# Patient Record
Sex: Female | Born: 1945 | Race: White | Hispanic: No | Marital: Single | Smoking: Never smoker
Health system: Southern US, Community
[De-identification: ages and names within clinical notes are randomized; demographics above are authoritative.]

## PROBLEM LIST (undated history)

## (undated) DIAGNOSIS — I951 Orthostatic hypotension: Secondary | ICD-10-CM

## (undated) DIAGNOSIS — F419 Anxiety disorder, unspecified: Secondary | ICD-10-CM

## (undated) DIAGNOSIS — M1990 Unspecified osteoarthritis, unspecified site: Secondary | ICD-10-CM

## (undated) DIAGNOSIS — F319 Bipolar disorder, unspecified: Secondary | ICD-10-CM

## (undated) DIAGNOSIS — I10 Essential (primary) hypertension: Secondary | ICD-10-CM

## (undated) DIAGNOSIS — R001 Bradycardia, unspecified: Secondary | ICD-10-CM

## (undated) DIAGNOSIS — Z950 Presence of cardiac pacemaker: Secondary | ICD-10-CM

## (undated) HISTORY — PX: ABDOMINAL HYSTERECTOMY: SHX81

## (undated) HISTORY — PX: CARDIAC SURGERY: SHX584

## (undated) HISTORY — PX: BACK SURGERY: SHX140

## (undated) HISTORY — PX: REPLACEMENT TOTAL KNEE: SUR1224

## (undated) HISTORY — PX: TONSILLECTOMY: SUR1361

---

## 2004-08-22 ENCOUNTER — Ambulatory Visit: Payer: Self-pay | Admitting: Unknown Physician Specialty

## 2004-09-25 ENCOUNTER — Ambulatory Visit: Payer: Self-pay | Admitting: Unknown Physician Specialty

## 2005-09-27 ENCOUNTER — Ambulatory Visit: Payer: Self-pay | Admitting: Unknown Physician Specialty

## 2006-02-28 ENCOUNTER — Ambulatory Visit: Payer: Self-pay | Admitting: Unknown Physician Specialty

## 2006-03-01 ENCOUNTER — Ambulatory Visit: Payer: Self-pay | Admitting: Surgery

## 2006-07-02 ENCOUNTER — Ambulatory Visit: Payer: Self-pay | Admitting: Unknown Physician Specialty

## 2006-08-14 ENCOUNTER — Inpatient Hospital Stay (HOSPITAL_COMMUNITY): Admission: RE | Admit: 2006-08-14 | Discharge: 2006-08-16 | Payer: Self-pay | Admitting: Neurosurgery

## 2006-10-03 ENCOUNTER — Ambulatory Visit: Payer: Self-pay | Admitting: Neurosurgery

## 2007-02-28 ENCOUNTER — Ambulatory Visit: Payer: Self-pay | Admitting: Internal Medicine

## 2007-03-15 ENCOUNTER — Ambulatory Visit: Payer: Self-pay | Admitting: Internal Medicine

## 2007-07-17 ENCOUNTER — Ambulatory Visit: Payer: Self-pay | Admitting: Family Medicine

## 2010-02-14 ENCOUNTER — Ambulatory Visit: Payer: Self-pay | Admitting: Gastroenterology

## 2010-05-15 ENCOUNTER — Ambulatory Visit: Payer: Self-pay | Admitting: Unknown Physician Specialty

## 2011-11-26 ENCOUNTER — Ambulatory Visit: Payer: Self-pay | Admitting: Unknown Physician Specialty

## 2012-12-01 ENCOUNTER — Ambulatory Visit: Payer: Self-pay | Admitting: Unknown Physician Specialty

## 2013-03-06 ENCOUNTER — Emergency Department: Payer: Self-pay | Admitting: Emergency Medicine

## 2013-03-06 LAB — CBC
MCHC: 35 g/dL (ref 32.0–36.0)
Platelet: 226 10*3/uL (ref 150–440)
RBC: 4.49 10*6/uL (ref 3.80–5.20)
RDW: 12.5 % (ref 11.5–14.5)
WBC: 5.4 10*3/uL (ref 3.6–11.0)

## 2013-03-06 LAB — URINALYSIS, COMPLETE
Bacteria: NONE SEEN
Blood: NEGATIVE
Nitrite: NEGATIVE
Protein: NEGATIVE
Squamous Epithelial: <1
WBC UR: 1 /HPF (ref 0–5)

## 2013-03-06 LAB — COMPREHENSIVE METABOLIC PANEL
Albumin: 3.9 g/dL (ref 3.4–5.0)
Bilirubin,Total: 0.5 mg/dL (ref 0.2–1.0)
Calcium, Total: 9.7 mg/dL (ref 8.5–10.1)
Chloride: 102 mmol/L (ref 98–107)
Creatinine: 1.57 mg/dL — ABNORMAL HIGH (ref 0.60–1.30)
EGFR (African American): 39 — ABNORMAL LOW
EGFR (Non-African Amer.): 34 — ABNORMAL LOW
Osmolality: 279 (ref 275–301)
Potassium: 3.5 mmol/L (ref 3.5–5.1)
SGOT(AST): 22 U/L (ref 15–37)
Sodium: 136 mmol/L (ref 136–145)

## 2013-03-15 ENCOUNTER — Inpatient Hospital Stay: Payer: Self-pay | Admitting: Internal Medicine

## 2013-03-15 LAB — BASIC METABOLIC PANEL
Calcium, Total: 9.1 mg/dL (ref 8.5–10.1)
Chloride: 107 mmol/L (ref 98–107)
Co2: 26 mmol/L (ref 21–32)
Creatinine: 1.19 mg/dL (ref 0.60–1.30)
EGFR (Non-African Amer.): 47 — ABNORMAL LOW
Glucose: 113 mg/dL — ABNORMAL HIGH (ref 65–99)
Sodium: 139 mmol/L (ref 136–145)

## 2013-03-15 LAB — TROPONIN I: Troponin-I: <0.02 ng/mL

## 2013-03-15 LAB — CBC
MCV: 85 fL (ref 80–100)
RDW: 12.8 % (ref 11.5–14.5)

## 2013-03-16 LAB — CBC WITH DIFFERENTIAL/PLATELET
Basophil %: 1 %
Eosinophil #: 0.2 10*3/uL (ref 0.0–0.7)
HGB: 11 g/dL — ABNORMAL LOW (ref 12.0–16.0)
Lymphocyte %: 42.3 %
MCH: 29.2 pg (ref 26.0–34.0)
MCV: 85 fL (ref 80–100)
Neutrophil %: 42.9 %
RBC: 3.77 10*6/uL — ABNORMAL LOW (ref 3.80–5.20)
RDW: 12.5 % (ref 11.5–14.5)

## 2013-03-16 LAB — BASIC METABOLIC PANEL
BUN: 19 mg/dL — ABNORMAL HIGH (ref 7–18)
Chloride: 109 mmol/L — ABNORMAL HIGH (ref 98–107)
Co2: 29 mmol/L (ref 21–32)
EGFR (African American): 54 — ABNORMAL LOW
Sodium: 142 mmol/L (ref 136–145)

## 2013-03-16 LAB — TSH: Thyroid Stimulating Horm: 2.4 u[IU]/mL

## 2013-03-16 LAB — CK TOTAL AND CKMB (NOT AT ARMC)
CK, Total: 51 U/L (ref 21–215)
CK-MB: <0.5 ng/mL — ABNORMAL LOW (ref 0.5–3.6)

## 2013-03-16 LAB — TROPONIN I: Troponin-I: <0.02 ng/mL

## 2013-03-17 LAB — CBC WITH DIFFERENTIAL/PLATELET
Basophil %: 0.8 %
Eosinophil #: 0.1 10*3/uL (ref 0.0–0.7)
HCT: 32.7 % — ABNORMAL LOW (ref 35.0–47.0)
HGB: 11.2 g/dL — ABNORMAL LOW (ref 12.0–16.0)
Lymphocyte #: 1.7 10*3/uL (ref 1.0–3.6)
MCH: 29 pg (ref 26.0–34.0)
Monocyte %: 10.5 %
Neutrophil #: 2.5 10*3/uL (ref 1.4–6.5)
WBC: 4.8 10*3/uL (ref 3.6–11.0)

## 2013-03-17 LAB — BASIC METABOLIC PANEL
Anion Gap: 5 — ABNORMAL LOW (ref 7–16)
Co2: 29 mmol/L (ref 21–32)
Creatinine: 1.1 mg/dL (ref 0.60–1.30)
EGFR (African American): >60
EGFR (Non-African Amer.): 52 — ABNORMAL LOW
Glucose: 109 mg/dL — ABNORMAL HIGH (ref 65–99)

## 2013-03-18 LAB — CBC WITH DIFFERENTIAL/PLATELET
Basophil #: 0.1 10*3/uL (ref 0.0–0.1)
Eosinophil #: 0.1 10*3/uL (ref 0.0–0.7)
Eosinophil %: 2.5 %
HCT: 32.9 % — ABNORMAL LOW (ref 35.0–47.0)
HGB: 11.4 g/dL — ABNORMAL LOW (ref 12.0–16.0)
Lymphocyte #: 1.2 10*3/uL (ref 1.0–3.6)
Lymphocyte %: 30.2 %
MCH: 29.1 pg (ref 26.0–34.0)
MCV: 84 fL (ref 80–100)
Monocyte #: 0.5 x10 3/mm (ref 0.2–0.9)
Monocyte %: 12.3 %
Platelet: 186 10*3/uL (ref 150–440)
RBC: 3.92 10*6/uL (ref 3.80–5.20)
RDW: 12.8 % (ref 11.5–14.5)

## 2013-03-18 LAB — BASIC METABOLIC PANEL
Chloride: 106 mmol/L (ref 98–107)
Osmolality: 285 (ref 275–301)
Potassium: 4.2 mmol/L (ref 3.5–5.1)

## 2013-07-20 ENCOUNTER — Emergency Department: Payer: Self-pay | Admitting: Emergency Medicine

## 2013-07-20 LAB — COMPREHENSIVE METABOLIC PANEL
BUN: 19 mg/dL — ABNORMAL HIGH (ref 7–18)
Calcium, Total: 9.3 mg/dL (ref 8.5–10.1)
Chloride: 103 mmol/L (ref 98–107)
Co2: 29 mmol/L (ref 21–32)
Creatinine: 1.21 mg/dL (ref 0.60–1.30)
EGFR (African American): 54 — ABNORMAL LOW
EGFR (Non-African Amer.): 46 — ABNORMAL LOW
Glucose: 107 mg/dL — ABNORMAL HIGH (ref 65–99)
Osmolality: 277 (ref 275–301)
SGOT(AST): 25 U/L (ref 15–37)
Sodium: 137 mmol/L (ref 136–145)

## 2013-07-20 LAB — CBC
MCH: 28.4 pg (ref 26.0–34.0)
MCV: 83 fL (ref 80–100)
RBC: 4.61 10*6/uL (ref 3.80–5.20)

## 2013-07-20 LAB — URINALYSIS, COMPLETE
Bacteria: NONE SEEN
Leukocyte Esterase: NEGATIVE
Ph: 7 (ref 4.5–8.0)
Squamous Epithelial: NONE SEEN
WBC UR: 1 /HPF (ref 0–5)

## 2013-07-20 LAB — DRUG SCREEN, URINE
Amphetamines, Ur Screen: NEGATIVE (ref ?–1000)
Barbiturates, Ur Screen: NEGATIVE (ref ?–200)
Cannabinoid 50 Ng, Ur ~~LOC~~: NEGATIVE (ref ?–50)
Phencyclidine (PCP) Ur S: NEGATIVE (ref ?–25)

## 2013-07-20 LAB — SALICYLATE LEVEL: Salicylates, Serum: <1.7 mg/dL

## 2013-10-19 ENCOUNTER — Ambulatory Visit: Payer: Self-pay | Admitting: Neurology

## 2013-11-16 ENCOUNTER — Ambulatory Visit: Payer: Self-pay | Admitting: Neurology

## 2014-01-07 ENCOUNTER — Ambulatory Visit: Payer: Self-pay | Admitting: Unknown Physician Specialty

## 2015-02-18 NOTE — Op Note (Signed)
PATIENT NAME:  Latoya DixonJOHNSON, [name] MR#:  [id number] DATE OF BIRTH:  April 16, 1946  DATE OF PROCEDURE:  03/17/2013  PRIMARY CARE PHYSICIAN:  Aram BeechamJeffrey Sparks, MD  PREPROCEDURE DIAGNOSIS: Sick sinus syndrome.   PROCEDURE: Dual chamber pacemaker implantation.   POSTPROCEDURE DIAGNOSIS: Atrial sensing with ventricular pacing.   INDICATION: The patient is a 69 year old female who was admitted on 03/16/2013 for chest pain. During the hospitalization, the patient has been noted to be bradycardic with sinus bradycardia with rates in the 30s and 40s. Procedure and the risks, benefits, and alternatives of permanent pacemaker implantation were explained to the patient and informed written consent was obtained.   DESCRIPTION OF PROCEDURE: She was brought to the operating room in a fasting state. The left pectoral region was prepped and draped in the usual sterile manner. Anesthesia was obtained with 1% Xylocaine locally. A 6 cm incision was performed over the left pectoral region. The pacemaker pocket was generated by electrocautery and blunt dissection. Access was obtained to the left subclavian vein by fine needle aspiration. Right ventricular and right atrial leads were positioned in the right ventricular apex and right atrial appendage under fluoroscopic guidance. After proper thresholds were obtained, the leads were sutured in place. The leads were connected to a dual chamber rate responsive pacemaker generator (Medtronic Adapta [id number]). The pacemaker pocket was irrigated with gentamicin solution. The pacemaker generator was positioned into the pocket; and the pocket was closed with 2-0 and 4-0 Vicryl, respectively. Steri-Strips and a pressure dressing were applied.  ____________________________ Marcina MillardAlexander Jed Kutch, MD ap:sb D: 03/17/2013 13:48:00 ET T: 03/17/2013 14:22:02 ET JOB#: [id number]  cc: Marcina MillardAlexander Mardell Cragg, MD, <Dictator> Marcina MillardALEXANDER Naphtali Zywicki MD ELECTRONICALLY SIGNED 03/20/2013 8:23

## 2015-02-18 NOTE — Consult Note (Signed)
PATIENT NAME:  Latoya [name], HANNIS MR#:  [id number] DATE OF BIRTH:  [birth year]  DATE OF CONSULTATION:  03/16/2013  REFERRING PHYSICIAN:  Aram Beecham, MD CONSULTING PHYSICIAN:  Lamar Blinks, MD  REASON FOR CONSULTATION: Bradycardia with unstable angina.   CHIEF COMPLAINT: "I have chest pain."   HISTORY OF PRESENT ILLNESS:  This is a 69 year old female with known hypertension, on appropriate medication with good blood pressure control, who has had new onset of substernal chest pain radiating into the back occurring at rest and with physical activity and spontaneously resolved without evidence of myocardial infarction with a normal troponin and an EKG showing sinus bradycardia with left axis deviation and right bundle branch block. She had a cardiac catheterization in 2008 showing normal coronary arteries. She has not had any beta blockers or other medications that could cause any significant bradycardia. The patient does have some resolution of this chest pain and is currently stable. There has been no other further significant symptoms this morning. Hypertension control has been quite good on losartan and the patient does have hyperlipidemia on appropriate medications without evidence of apparent side effects.  The patient has had some bradycardia in the last several weeks, significantly last night to a heart rate of 34 beats per minute, but no evidence of symptoms other than dizziness. There has been on syncope at this time.   REVIEW OF SYSTEMS: The remainder is negative for vision change, ringing in the ears, hearing loss, cough, congestion, heartburn, nausea, vomiting, diarrhea, bloody stools, stomach pain, extremity pain, leg weakness, cramping of the buttocks, known blood clots, headaches, blackouts, nosebleeds, congestion, trouble swallowing, frequent urination at night, muscle weakness, numbness, anxiety, depression, skin lesions, or skin rashes.   PAST MEDICAL HISTORY: 1.  Hypertension.   2.  Hyperlipidemia. 3.  Bradycardia.   FAMILY HISTORY: Father and mother have had sick sinus syndrome and coronary artery disease in a brother.   SOCIAL HISTORY: Currently denies tobacco use. Occasionally drinking alcohol.   ALLERGIES: Listed.   MEDICATIONS:  As listed.  PHYSICAL EXAMINATION: VITAL SIGNS: Blood pressure is 126/68 bilaterally and heart rate is 56 upright and reclining and regular.  GENERAL: She is a well appearing female in no acute distress.  HEENT: No icterus, thyromegaly, ulcers, hemorrhage, or xanthelasma.  HEART:  Regular rate and rhythm. Normal S1 and S2 without murmur, gallop, or rub. PMI is normal size and placement. Carotid upstroke normal without bruit. Jugular venous pressure normal. LUNGS: Few basilar crackles with normal respirations.  ABDOMEN: Soft and nontender without hepatosplenomegaly or masses. Abdominal aorta is normal size without bruit.  EXTREMITIES: 2+ bilateral pulses in dorsal, pedal, radial, and femoral arteries without lower extremity edema, cyanosis, clubbing, or ulcers.  NEUROLOGIC: Oriented to time, place, and person with normal mood and affect.   ASSESSMENT: A 69 year old female with hypertension, hyperlipidemia, significant bradycardia with dizziness and family history of cardiovascular disease with an abnormal EKG needing further evaluation and treatment options with no evidence of myocardial infarction at this time.   RECOMMENDATIONS: 1.  Further serial ECG and enzymes to assess for possible myocardial infarction.  2.  Treadmill EKG to assess for chronotropic incompetence, myocardial ischemia, chest discomfort, and worsening symptoms.  3.  Unable to give beta blocker due to bradycardia.  4.  Echocardiogram for left ventricular systolic dysfunction and valvular heart disease contributing to current issues, chest discomfort, and bradycardia with adjustments thereof as necessary.  5.  Further evaluation of the possibility of pacemaker  placement with dual-chamber pacemaker  if necessary for symptomatic bradycardia. The patient understands the risks and benefits of pacemaker placement including death, stroke, bleeding, blood clot, bradycardia, and reactions to medications. She is at low risk for sedation  without changes at this time.  ____________________________ Lamar BlinksBruce J. Calin Ellery, MD bjk:sb D: 03/16/2013 09:37:25 ET T: 03/16/2013 12:59:49 ET JOB#: [id number]  cc: Lamar BlinksBruce J. Severus Brodzinski, MD, <Dictator> Lamar BlinksBRUCE J Chimaobi Casebolt MD ELECTRONICALLY SIGNED 03/18/2013 14:11

## 2015-02-18 NOTE — Discharge Summary (Signed)
PATIENT NAME:  Latoya [name], Latoya [name] MR#:  [id number] DATE OF BIRTH:  13-Jun-1946  DATE OF ADMISSION:  03/15/2013 DATE OF DISCHARGE:  03/18/2013  REASON FOR ADMISSION: Chest pain, with bradycardia.   HISTORY OF PRESENT ILLNESS: Please see the dictated HPI done by [place] on 03/15/2013.   PAST MEDICAL HISTORY: 1.  Depression.  2.  Osteoarthritis.  3.  Bipolar disorder.  4.  Irritable bowel syndrome.  5.  Benign hypertension.  6.  Obesity.  7.  Reactive airways disease.   MEDICATIONS ON ADMISSION: Please see admission note.   ALLERGIES: LATEX and PENICILLIN.   SOCIAL HISTORY: Negative for alcohol or tobacco abuse.   FAMILY HISTORY: Positive for coronary artery disease, but otherwise unremarkable.   REVIEW OF SYSTEMS: As per HPI.   PHYSICAL EXAMINATION: GENERAL: The patient was in no acute distress.  VITAL SIGNS: Vital signs remarkable for [name] blood pressure of 130/69, with [name] heart rate of 50, which dropped to 38 on telemetry.  HEENT EXAM: Unremarkable.  NECK: Was supple, without JVD.  LUNGS: Were clear.  CARDIAC EXAM: Revealed [name] slow rate, with [name] regular rhythm. Normal S1, S2.  ABDOMEN: Was soft and nontender.  EXTREMITIES: Without edema.  NEUROLOGIC EXAM: Was grossly nonfocal.   HOSPITAL COURSE: The patient was admitted with bradycardia and dizziness. She was monitored on telemetry. She was seen in consultation by cardiology. The patient underwent echocardiogram and stress Myoview, which were both unremarkable. Pacemaker implantation was recommended and the patient agreed. The patient underwent pacemaker implant on 03/17/2013. This was done by Dr. Darrold JunkerParaschos. The patient tolerated the procedure well. She was monitored overnight on telemetry with her pacemaker and did well. Her cardiac enzymes were normal throughout her hospitalization. By 03/18/2013 the patient was stable and ready for discharge.   DISCHARGE DIAGNOSES: 1.  Symptomatic bradycardia.  2.  Dizziness.  3.  Benign  hypertension.  4.  Sick sinus syndrome.  5.  Bipolar disorder.  6.  Depression.   DISCHARGE MEDICATIONS: 1.  Losartan 100 mg p.o. daily.  2.  Celebrex 200 mg p.o. b.i.d.  3.  Klonopin 0.5 mg p.o. b.i.d.  4.  Lamictal 225 mg p.o. at bedtime.  5.  Trazodone 50 mg p.o. at bedtime.  6.  Risperdal 0.5 mg p.o. at bedtime.  7.  Lovastatin 20 mg p.o. at bedtime.  8.  Vitamin D3 2000 units p.o. daily.  9.  Aspirin 81 mg p.o. daily.  10.  Zoloft 50 mg p.o. daily.  11.  Keflex 250 mg p.o. 4 times daily x 10 days.   FOLLOW-UP PLANS AND APPOINTMENTS: The patient was discharged on [name] low sodium diet. No heavy lifting. She is not to shower until the day after discharge.   She will follow with myself and Dr. Darrold JunkerParaschos in the office in 7 to 10 days; sooner if needed.   ____________________________ Duane LopeJeffrey D. Judithann SheenSparks, MD jds:dm D: 03/22/2013 01:37:34 ET T: 03/22/2013 10:32:19 ET JOB#: [id number]  cc: Duane LopeJeffrey D. Judithann SheenSparks, MD, <Dictator> Tahje Borawski Rodena Medin Meaghen Vecchiarelli MD ELECTRONICALLY SIGNED 03/23/2013 12:15

## 2015-02-18 NOTE — H&P (Signed)
PATIENT NAME:  Latoya [name], Latoya [name] MR#:  [id number] DATE OF BIRTH:  08-17-1946  DATE OF ADMISSION:  03/15/2013  PRIMARY CARE PHYSICIAN: Maurine MinisterMiriam McLaughlin, PA[facility]C [facility]  (The patient used to see Silver HugueninAileen Miller, MD.)  ER REFERRING PHYSICIAN: Su Leyobert L. Kinner, MD    CHIEF COMPLAINT: Chest pain.   HISTORY OF PRESENT ILLNESS: [name] 69 year old female patient with history of depression, bipolar disorder, came in because of chest pain started this morning. The patient says that she has chest pain on the right side of the chest not radiating to the arms or the back.  It is like [name] sharp pain, 10 out of 10 in severity. Also, she has had some nausea and shortness of breath. No cough. No fever. The patient has been having dizziness for the past 2 weeks, had [name] Holter as an outpatient, and she just finished the Holter on today morning. The patient had [name] Holter put on Friday, and today morning she finished it, and the patient has been having low heart rate around 40s, and that is why she had the Holter done.  She is supposed to see Dr. Gwen PoundsKowalski. The patient says that she has been having dizziness for the past 2 weeks, did not have any syncope and did not get any new medications. Here, the heart rate is as low as 39 on the telemetry. The patient's EKG shows sinus brady with 52 beats and no ST-T changes.   PAST MEDICAL HISTORY: Significant for depression, degenerative joint disease, bipolar disorder, irritable bowel syndrome, hypertension, obesity, reactive airway disease.   ALLERGIES: SHE IS ALLERGIC TO LATEX TAPE AND PENICILLIN.   SOCIAL HISTORY: No smoking, no drinking.   FAMILY HISTORY: Significant for coronary artery disease. The patient's brother had bypass surgery at the age of 70.  Mother had heart disease at the age of 70, and even other sisters also had coronary artery disease with stents and pacemakers.   PAST SURGICAL HISTORY: Significant for tonsillectomy, oophorectomy, hysterectomy, right  inguinal hernia surgery, history of L4 to L5 herniated disk surgery, history of bilateral foot surgeries and history of right ankle fracture. The patient had [name] surgery for the back. She had [name] right knee placement in 2004.  MEDICATIONS: The patient is on Lamictal 200 mg daily, lovastatin 20 mg daily, Zoloft 50 mg p.o. daily, trazodone 50 mg at bedtime, Celebrex 200 mg p.o. b.i.d., losartan 100 mg every day.   NOTE:  HCTZ 25 mg is listed as an at home med, but she said she is not taking that anymore, recently stopped by PMD.   REVIEW OF SYSTEMS:  CONSTITUTIONAL: The patient feels dizzy. No fever. No chills.  EYES: No blurred vision.  ENT: No tinnitus. No epistaxis. No difficulty swallowing.  RESPIRATORY: No cough. No wheezing.  CARDIOVASCULAR: Has chest pain on the right side. No orthopnea. The patient does have some pedal edema. No palpitations. No syncope.  GASTROINTESTINAL: The patient does have some nausea but no vomiting, no diarrhea. No abdominal pain. No GERD.  GENITOURINARY: No dysuria.  ENDOCRINE: No polyuria or nocturia.  HEMATOLOGIC: No anemia or easy bruising.  INTEGUMENTARY: No skin rashes.  MUSCULOSKELETAL: Has back pain and joint pain.  NEUROLOGICAL: No numbness or weakness. No dysarthria.  PSYCHIATRIC: The patient does have bipolar disorder, anxiety, depression.   PHYSICAL EXAMINATION: GENERAL:  This is [name] 69 year old elderly female not in distress, answers appropriately, well-nourished and well-developed.  VITAL SIGNS: Temperature is 98.5, heart rate 51, but it has drop to  around 38 or 39 on telemetry.  Blood pressure 130/69, sats 99% on room air.  GENERAL: Alert, awake, oriented, not in distress.  HEENT: Head is normocephalic, atraumatic.  Pupils are equal, reacting to light. Nose:  No turbinate hypertrophy.  Ears:  No tympanic membrane congestion. Mouth:  No lesions in the mouth.  NECK: Supple. No JVD.  No thyroid enlargement. The patient has range of motion without pain.  RESPIRATIONS:  Clear to auscultation.  No wheeze. No rales.  CARDIOVASCULAR: The patient does have some chest wall tenderness on the right side of the chest.  S1 and S2 is regular, bradycardic. No murmurs. PMI is not displaced. Pulses are equal in carotid, femoral, and pedal pulses. The patient does have some peripheral edema. GASTROINTESTINAL:  The patient's abdomen is soft, nontender, nondistended.  Bowel sounds are present. No organomegaly.  MUSCULOSKELETAL: No joint pains at this time.  The patient does not have any joint effusion. The patient's strength and tone are equal bilaterally.  SKIN: Inspection is normal, well hydrated, no diaphoresis.  LYMPH:  No cervical lymphadenopathy.  VASCULAR:  Good pedal pulses.  NEUROLOGIC: Alert, awake, oriented. Cranial nerves II through XII are intact. Power 5 out of 5 upper and lower extremities.  Sensory intact. DTRs 2+ bilaterally.  PSYCHIATRIC: Slightly anxious but judgment and insight are adequate.   LABORATORY AND RADIOLOGICAL DATA:  Chest x-ray:  Mildly increased perihilar markings are noted, especially on the right. No focal pneumonia or evidence of pulmonary vascular congestion.   WBC 5.1, hemoglobin 12.1, hematocrit 33.7, platelets 202. Electrolytes: Sodium 139, potassium 4.2, chloride 107, bicarbonate 26, BUN 25, creatinine 1.1 and glucose 113. Troponin less than 0.02.   EKG: Sinus bradycardia with 52 beats per minute. The patient has some T-wave inversion in V1 and V2 which are nonspecific. The patient's rate is around 52 beats per minute.   ASSESSMENT AND PLAN: [name] 69 year old female with: 1.  Right side chest pain:  The patient's troponins have been negative. Her EKG is normal. The patient likely has some costochondritis, but because of her to age and also family history of coronary artery disease the patient will be admitted to hospitalist service.  We will continue aspirin and nitroglycerin.  Unable to give beta blocker because of bradycardia.   2.  Bradycardia with dizziness:  The patient's heart rate is as low as 39. The patient already had [name] Holter, and Dr. Gwen Pounds is supposed to see her as an outpatient, but we will request an inpatient consult because the patient does have symptoms due to bradycardia.  Keep her on telemetry.\  \\\rThe patient's medications are reviewed. The only medication that can affect the heart rate is Risperdal, so we are going to stop the Risperdal but continue other medications.  3.  Depression and bipolar:  She is on Zoloft, Clonazepam,  Lamictal and trazodone, continue them.  4.  Hypertension:  She is on losartan, which should be continued, but we will hold HCTZ as she has already stopped as an outpatient.   I discussed the plan with the patient and patient's sister.    TIME SPENT: About 55 minutes.    ____________________________ Katha Hamming, MD sk:cb D: 03/15/2013 17:13:35 ET T: 03/15/2013 17:39:45 ET JOB#: [id number]  cc: Katha Hamming, MD, <Dictator> Maurine Minister, PA-C Katha Hamming MD ELECTRONICALLY SIGNED 03/24/2013 23:00

## 2015-04-05 ENCOUNTER — Other Ambulatory Visit: Payer: Self-pay | Admitting: Family Medicine

## 2015-04-05 DIAGNOSIS — Z1231 Encounter for screening mammogram for malignant neoplasm of breast: Secondary | ICD-10-CM

## 2015-04-13 ENCOUNTER — Ambulatory Visit
Admission: RE | Admit: 2015-04-13 | Discharge: 2015-04-13 | Disposition: A | Discharge status: Home / Self Care | Payer: Medicare Other | Source: Ambulatory Visit | Attending: Family Medicine | Admitting: Family Medicine

## 2015-04-13 DIAGNOSIS — Z1231 Encounter for screening mammogram for malignant neoplasm of breast: Secondary | ICD-10-CM | POA: Diagnosis present

## 2016-03-07 ENCOUNTER — Encounter: Payer: Self-pay | Admitting: Emergency Medicine

## 2016-03-07 ENCOUNTER — Ambulatory Visit
Admission: EM | Admit: 2016-03-07 | Discharge: 2016-03-07 | Disposition: A | Discharge status: Home / Self Care | Payer: Medicare HMO | Attending: Family Medicine | Admitting: Family Medicine

## 2016-03-07 DIAGNOSIS — Z85528 Personal history of other malignant neoplasm of kidney: Secondary | ICD-10-CM

## 2016-03-07 DIAGNOSIS — A084 Viral intestinal infection, unspecified: Secondary | ICD-10-CM

## 2016-03-07 DIAGNOSIS — E871 Hypo-osmolality and hyponatremia: Secondary | ICD-10-CM

## 2016-03-07 HISTORY — DX: Bipolar disorder, unspecified: F31.9

## 2016-03-07 HISTORY — DX: Unspecified osteoarthritis, unspecified site: M19.90

## 2016-03-07 HISTORY — DX: Anxiety disorder, unspecified: F41.9

## 2016-03-07 HISTORY — DX: Orthostatic hypotension: I95.1

## 2016-03-07 HISTORY — DX: Bradycardia, unspecified: R00.1

## 2016-03-07 HISTORY — DX: Presence of cardiac pacemaker: Z95.0

## 2016-03-07 HISTORY — DX: Essential (primary) hypertension: I10

## 2016-03-07 LAB — URINALYSIS COMPLETE WITH MICROSCOPIC (ARMC ONLY)
BILIRUBIN URINE: NEGATIVE
Bacteria, UA: NONE SEEN
Glucose, UA: NEGATIVE mg/dL
Hgb urine dipstick: NEGATIVE
KETONES UR: NEGATIVE mg/dL
LEUKOCYTES UA: NEGATIVE
Nitrite: NEGATIVE
PH: 5.5 (ref 5.0–8.0)
PROTEIN: NEGATIVE mg/dL
WBC, UA: NONE SEEN WBC/hpf (ref 0–5)

## 2016-03-07 LAB — COMPREHENSIVE METABOLIC PANEL
ALT: 18 U/L (ref 14–54)
AST: 26 U/L (ref 15–41)
Albumin: 4.6 g/dL (ref 3.5–5.0)
Alkaline Phosphatase: 72 U/L (ref 38–126)
Anion gap: 8 (ref 5–15)
BUN: 27 mg/dL — AB (ref 6–20)
CO2: 26 mmol/L (ref 22–32)
CREATININE: 1.18 mg/dL — AB (ref 0.44–1.00)
Calcium: 9.6 mg/dL (ref 8.9–10.3)
Chloride: 95 mmol/L — ABNORMAL LOW (ref 101–111)
GFR, EST AFRICAN AMERICAN: 53 mL/min — AB (ref >60)
GFR, EST NON AFRICAN AMERICAN: 46 mL/min — AB (ref >60)
Glucose, Bld: 129 mg/dL — ABNORMAL HIGH (ref 65–99)
POTASSIUM: 3.7 mmol/L (ref 3.5–5.1)
Sodium: 129 mmol/L — ABNORMAL LOW (ref 135–145)
TOTAL PROTEIN: 8.3 g/dL — AB (ref 6.5–8.1)
Total Bilirubin: 0.8 mg/dL (ref 0.3–1.2)

## 2016-03-07 MED ORDER — KETOROLAC TROMETHAMINE 60 MG/2ML IM SOLN
15.0000 mg | Freq: Once | INTRAMUSCULAR | Status: AC
  Administered 2016-03-07: 15 mg via INTRAMUSCULAR

## 2016-03-07 NOTE — Discharge Instructions (Signed)
Food Choices to Help Relieve Diarrhea, Adult °When you have diarrhea, the foods you eat and your eating habits are very important. Choosing the right foods and drinks can help relieve diarrhea. Also, because diarrhea can last up to 7 days, you need to replace lost fluids and electrolytes (such as sodium, potassium, and chloride) in order to help prevent dehydration.  °WHAT GENERAL GUIDELINES DO I NEED TO FOLLOW? °· Slowly drink 1 cup (8 oz) of fluid for each episode of diarrhea. If you are getting enough fluid, your urine will be clear or pale yellow. °· Eat starchy foods. Some good choices include white rice, white toast, pasta, low-fiber cereal, baked potatoes (without the skin), saltine crackers, and bagels. °· Avoid large servings of any cooked vegetables. °· Limit fruit to two servings per day. A serving is ½ cup or 1 small piece. °· Choose foods with less than 2 g of fiber per serving. °· Limit fats to less than 8 tsp (38 g) per day. °· Avoid fried foods. °· Eat foods that have probiotics in them. Probiotics can be found in certain dairy products. °· Avoid foods and beverages that may increase the speed at which food moves through the stomach and intestines (gastrointestinal tract). Things to avoid include: °· High-fiber foods, such as dried fruit, raw fruits and vegetables, nuts, seeds, and whole grain foods. °· Spicy foods and high-fat foods. °· Foods and beverages sweetened with high-fructose corn syrup, honey, or sugar alcohols such as xylitol, sorbitol, and mannitol. °WHAT FOODS ARE RECOMMENDED? °Grains °White rice. White, French, or pita breads (fresh or toasted), including plain rolls, buns, or bagels. White pasta. Saltine, soda, or graham crackers. Pretzels. Low-fiber cereal. Cooked cereals made with water (such as cornmeal, farina, or cream cereals). Plain muffins. Matzo. Melba toast. Zwieback.  °Vegetables °Potatoes (without the skin). Strained tomato and vegetable juices. Most well-cooked and canned  vegetables without seeds. Tender lettuce. °Fruits °Cooked or canned applesauce, apricots, cherries, fruit cocktail, grapefruit, peaches, pears, or plums. Fresh bananas, apples without skin, cherries, grapes, cantaloupe, grapefruit, peaches, oranges, or plums.  °Meat and Other Protein Products °Baked or boiled chicken. Eggs. Tofu. Fish. Seafood. Smooth peanut butter. Ground or well-cooked tender beef, ham, veal, lamb, pork, or poultry.  °Dairy °Plain yogurt, kefir, and unsweetened liquid yogurt. Lactose-free milk, buttermilk, or soy milk. Plain hard cheese. °Beverages °Sport drinks. Clear broths. Diluted fruit juices (except prune). Regular, caffeine-free sodas such as ginger ale. Water. Decaffeinated teas. Oral rehydration solutions. Sugar-free beverages not sweetened with sugar alcohols. °Other °Bouillon, broth, or soups made from recommended foods.  °The items listed above may not be a complete list of recommended foods or beverages. Contact your dietitian for more options. °WHAT FOODS ARE NOT RECOMMENDED? °Grains °Whole grain, whole wheat, bran, or rye breads, rolls, pastas, crackers, and cereals. Wild or brown rice. Cereals that contain more than 2 g of fiber per serving. Corn tortillas or taco shells. Cooked or dry oatmeal. Granola. Popcorn. °Vegetables °Raw vegetables. Cabbage, broccoli, Brussels sprouts, artichokes, baked beans, beet greens, corn, kale, legumes, peas, sweet potatoes, and yams. Potato skins. Cooked spinach and cabbage. °Fruits °Dried fruit, including raisins and dates. Raw fruits. Stewed or dried prunes. Fresh apples with skin, apricots, mangoes, pears, raspberries, and strawberries.  °Meat and Other Protein Products °Chunky peanut butter. Nuts and seeds. Beans and lentils. Bacon.  °Dairy °High-fat cheeses. Milk, chocolate milk, and beverages made with milk, such as milk shakes. Cream. Ice cream. °Sweets and Desserts °Sweet rolls, doughnuts, and sweet breads.   Pancakes and waffles. Fats and  Oils Butter. Cream sauces. Margarine. Salad oils. Plain salad dressings. Olives. Avocados.  Beverages Caffeinated beverages (such as coffee, tea, soda, or energy drinks). Alcoholic beverages. Fruit juices with pulp. Prune juice. Soft drinks sweetened with high-fructose corn syrup or sugar alcohols. Other Coconut. Hot sauce. Chili powder. Mayonnaise. Gravy. Cream-based or milk-based soups.  The items listed above may not be a complete list of foods and beverages to avoid. Contact your dietitian for more information. WHAT SHOULD I DO IF I BECOME DEHYDRATED? Diarrhea can sometimes lead to dehydration. Signs of dehydration include dark urine and dry mouth and skin. If you think you are dehydrated, you should rehydrate with an oral rehydration solution. These solutions can be purchased at pharmacies, retail stores, or online.  Drink -1 cup (120-240 mL) of oral rehydration solution each time you have an episode of diarrhea. If drinking this amount makes your diarrhea worse, try drinking smaller amounts more often. For example, drink 1-3 tsp (5-15 mL) every 5-10 minutes.  A general rule for staying hydrated is to drink 1-2 L of fluid per day. Talk to your health care provider about the specific amount you should be drinking each day. Drink enough fluids to keep your urine clear or pale yellow.   This information is not intended to replace advice given to you by your health care provider. Make sure you discuss any questions you have with your health care provider.   Document Released: 01/05/2004 Document Revised: 11/05/2014 Document Reviewed: 09/07/2013 Elsevier Interactive Patient Education 2016 [employer]  Hyponatremia Hyponatremia is when the amount of salt (sodium) in your blood is too low. When salt levels are low, your cells absorb extra water and they swell. The swelling happens throughout the body, but it mostly affects the brain.  HOME CARE  Take medicines only as told by your doctor.  Many medicines can make this condition worse. Talk with your doctor about any medicines that you are currently taking.  Carefully follow a recommended diet as told by your doctor.  Carefully follow instructions from your doctor about fluid restrictions.  Keep all follow-up visits as told by your doctor. This is important.  Do not drink alcohol. GET HELP IF:  You feel sicker to your stomach (nauseous).  You feel more confused.  You feel more tired (fatigued).  Your headache gets worse.  You feel weaker.  Your symptoms go away and then they come back.  You have trouble following the diet instructions. GET HELP RIGHT AWAY IF:  You start to twitch and shake (have a seizure).  You pass out (faint).  You keep having watery poop (diarrhea).  You keep throwing up (vomiting).   This information is not intended to replace advice given to you by your health care provider. Make sure you discuss any questions you have with your health care provider.   Document Released: 06/27/2011 Document Revised: 03/01/2015 Document Reviewed: 10/11/2014 Elsevier Interactive Patient Education 2016 [employer] Dehydration Dehydration is when you lose more fluids from the body than you take in. Vital organs such as the kidneys, brain, and heart cannot function without a proper amount of fluids and salt. Any loss of fluids from the body can cause dehydration.  Older adults are at a higher risk of dehydration than younger adults. As we age, our bodies are less able to conserve water and do not respond to temperature changes as well. Also, older adults do not become thirsty as easily or quickly. Because of  this, older adults often do not realize they need to increase fluids to avoid dehydration.  CAUSES   Vomiting.  Diarrhea.  Excessive sweating.  Excessive urination.  Fever.  Certain medicines, such as blood pressure medicines called diuretics.  Poorly controlled blood sugars. SIGNS AND  SYMPTOMS  Mild dehydration:  Thirst.  Dry lips.  Slightly dry mouth. Moderate dehydration:  Very dry mouth.  Sunken eyes.  Skin does not bounce back quickly when lightly pinched and released.  Dark urine and decreased urine production.  Decreased tear production.  Headache. Severe dehydration:  Very dry mouth.  Extreme thirst.  Rapid, weak pulse (more than 100 beats per minute at rest).  Cold hands and feet.  Not able to sweat in spite of heat.  Rapid breathing.  Blue lips.  Confusion and lethargy.  Difficulty being awakened.  Minimal urine production.  No tears. DIAGNOSIS  Your health care provider will diagnose dehydration based on your symptoms and your exam. Blood and urine tests will help confirm the diagnosis. The diagnostic evaluation should also identify the cause of dehydration. TREATMENT  Treatment of mild or moderate dehydration can often be done at home by increasing the amount of fluids that you drink. It is best to drink small amounts of fluid more often. Drinking too much at one time can make vomiting worse. Severe dehydration needs to be treated at the hospital. You may be given IV fluids that contain water and electrolytes. HOME CARE INSTRUCTIONS   Ask your health care provider about specific rehydration instructions.  Drink enough fluids to keep your urine clear or pale yellow.  Drink small amounts frequently if you have nausea and vomiting.  Eat as you normally do.  Avoid:  Foods or drinks high in sugar.  Carbonated drinks.  Juice.  Extremely hot or cold fluids.  Drinks with caffeine.  Fatty, greasy foods.  Alcohol.  Tobacco.  Overeating.  Gelatin desserts.  Wash your hands well to avoid spreading bacteria and viruses.  Only take over-the-counter or prescription medicines for pain, discomfort, or fever as directed by your health care provider.  Ask your health care provider if you should continue all prescribed and  over-the-counter medicines.  Keep all follow-up appointments with your health care provider. SEEK MEDICAL CARE IF:  You have abdominal pain, and it increases or stays in one area (localizes).  You have a rash, stiff neck, or severe headache.  You are irritable, sleepy, or difficult to awaken.  You are weak, dizzy, or extremely thirsty.  You have a fever. SEEK IMMEDIATE MEDICAL CARE IF:   You are unable to keep fluids down, or you get worse despite treatment.  You have frequent episodes of vomiting or diarrhea.  You have blood or green matter (bile) in your vomit.  You have blood in your stool, or your stool looks black and tarry.  You have not urinated in 6-8 hours, or you have only urinated a small amount of very dark urine.  You faint. MAKE SURE YOU:   Understand these instructions.  Will watch your condition.  Will get help right away if you are not doing well or get worse.   This information is not intended to replace advice given to you by your health care provider. Make sure you discuss any questions you have with your health care provider.   Document Released: 01/05/2004 Document Revised: 10/20/2013 Document Reviewed: 06/22/2013 Elsevier Interactive Patient Education 2016 [employer] Norovirus Infection A norovirus infection is caused by exposure to  a virus in a group of similar viruses (noroviruses). This type of infection causes inflammation in your stomach and intestines (gastroenteritis). Norovirus is the most common cause of gastroenteritis. It also causes food poisoning. Anyone can get a norovirus infection. It spreads very easily (contagious). You can get it from contaminated food, water, surfaces, or other people. Norovirus is found in the stool or vomit of infected people. You can spread the infection as soon as you feel sick until 2 weeks after you recover.  Symptoms usually begin within 2 days after you become infected. Most norovirus symptoms affect the  digestive system. CAUSES Norovirus infection is caused by contact with norovirus. You can catch norovirus if you:  Eat or drink something contaminated with norovirus.  Touch surfaces or objects contaminated with norovirus and then put your hand in your mouth.  Have direct contact with an infected person who has symptoms.  Share food, drink, or utensils with someone with who is sick with norovirus. SIGNS AND SYMPTOMS Symptoms of norovirus may include:  Nausea.  Vomiting.  Diarrhea.  Stomach cramps.  Fever.  Chills.  Headache.  Muscle aches.  Tiredness. DIAGNOSIS Your health care provider may suspect norovirus based on your symptoms and physical exam. Your health care provider may also test a sample of your stool or vomit for the virus.  TREATMENT There is no specific treatment for norovirus. Most people get better without treatment in about 2 days. HOME CARE INSTRUCTIONS  Replace lost fluids by drinking plenty of water or rehydration fluids containing important minerals called electrolytes. This prevents dehydration. Drink enough fluid to keep your urine clear or pale yellow.  Do not prepare food for others while you are infected. Wait at least 3 days after recovering from the illness to do that. PREVENTION   Wash your hands often, especially after using the toilet or changing a diaper.  Wash fruits and vegetables thoroughly before preparing or serving them.  Throw out any food that a sick person may have touched.  Disinfect contaminated surfaces immediately after someone in the household has been sick. Use a bleach-based household cleaner.  Immediately remove and wash soiled clothes or sheets. SEEK MEDICAL CARE IF:  Your vomiting, diarrhea, and stomach pain is getting worse.  Your symptoms of norovirus do not go away after 2-3 days. SEEK IMMEDIATE MEDICAL CARE IF:  You develop symptoms of dehydration that do not improve with fluid replacement. This may  include:  Excessive sleepiness.  Lack of tears.  Dry mouth.  Dizziness when standing.  Weak pulse.   This information is not intended to replace advice given to you by your health care provider. Make sure you discuss any questions you have with your health care provider.   Document Released: 01/05/2003 Document Revised: 11/05/2014 Document Reviewed: 03/25/2014 Elsevier Interactive Patient Education [employer]

## 2016-03-07 NOTE — ED Notes (Signed)
Patient had a fasting lab work this AM. Patient ate breakfast and went to the Gym to exercise. After working out on the bike she went to the treadmill and became dizzy and nauseated. 1 day ago patient was in the bed with right lower abdominal pain. Patient has a history of right kidney cancer.

## 2016-03-07 NOTE — ED Provider Notes (Signed)
CSN: [id number]     Arrival date & time 03/07/16  1203 History   First MD Initiated Contact with Patient 03/07/16 1247     Chief Complaint  Patient presents with  . Near Syncope  . Nausea   (Consider location/radiation/quality/duration/timing/severity/associated sxs/prior Treatment) HPI Comments: Single caucasian female lives with dog and sister was not feeling well yesterday chills had to turn heat on in house wearing sweater/pants also.  Weather cooler than normal this week denied known ill contacts didn't work out yesterday.  Today abdomen and right flank pain had fasting labs at [facility] [place] then went to have breakfast and work out at gym.  While on bike started to feel weaker started treadmill and tunnel vision stopped and another gym patron (nurse) helped her sit down.  Sister went to pick her up and brought her here for evaluation.  Kidney removal scheduled for later this summer per patient secondary to cancer diagnosed Apr 2017.  Drinks a lot of water still behind her usual amount today 40 oz by 1000 typically.  Hx low sodium  In blood this past year that is why labs being drawn and due to worsening kidney function PCM discontinued her celebrex last year.  Patient is a 70 y.o. female presenting with near-syncope. The history is provided by the patient and a friend.  Near Syncope This is a new problem. The current episode started 1 to 2 hours ago. The problem occurs rarely. The problem has been gradually improving. Associated symptoms include abdominal pain. Pertinent negatives include no chest pain, no headaches and no shortness of breath. The symptoms are aggravated by bending, exertion and standing. The symptoms are relieved by lying down and rest. She has tried food, water and rest for the symptoms. The treatment provided moderate relief.    Past Medical History  Diagnosis Date  . Hypertension   . Arthritis   . Anxiety   . Bradycardia   .  Orthostatic hypotension   . Pacemaker   . Bipolar disorder [facility])    Past Surgical History  Procedure Laterality Date  . Replacement total knee Right   . Cardiac surgery    . Tonsillectomy    . Back surgery     Family History  Problem Relation Age of Onset  . Breast cancer Maternal Aunt 4   Social History  Substance Use Topics  . Smoking status: Never Smoker   . Smokeless tobacco: Never Used  . Alcohol Use: No   OB History    No data available     Review of Systems  Constitutional: Positive for chills, activity change and appetite change. Negative for fever, diaphoresis, fatigue and unexpected weight change.  HENT: Positive for ear pain, postnasal drip and sinus pressure. Negative for congestion, dental problem, drooling, ear discharge, facial swelling, hearing loss, mouth sores, nosebleeds, rhinorrhea, sneezing, sore throat, tinnitus, trouble swallowing and voice change.   Eyes: Negative for photophobia, pain, discharge, redness, itching and visual disturbance.  Respiratory: Negative for cough, choking, chest tightness, shortness of breath, wheezing and stridor.   Cardiovascular: Positive for near-syncope. Negative for chest pain, palpitations and leg swelling.  Gastrointestinal: Positive for abdominal pain. Negative for nausea, vomiting, diarrhea, constipation, blood in stool and abdominal distention.  Endocrine: Positive for cold intolerance. Negative for heat intolerance.  Genitourinary: Positive for flank pain and decreased urine volume. Negative for dysuria, hematuria and difficulty urinating.  Musculoskeletal: Negative for myalgias, back pain, joint swelling, arthralgias,  gait problem, neck pain and neck stiffness.  Skin: Negative for color change, pallor, rash and wound.  Allergic/Immunologic: Positive for environmental allergies. Negative for food allergies.  Neurological: Positive for dizziness, weakness and light-headedness. Negative for tremors, seizures, syncope,  facial asymmetry, speech difficulty, numbness and headaches.  Hematological: Negative for adenopathy. Does not bruise/bleed easily.  Psychiatric/Behavioral: Negative for behavioral problems, confusion, sleep disturbance and agitation.    Allergies  Cefdinir; Penicillins; Tape; and Beta adrenergic blockers  Home Medications   Prior to Admission medications   Medication Sig Start Date End Date Taking? Authorizing Provider  acetaminophen (TYLENOL) 500 MG tablet Take 1,000 mg by mouth every 6 (six) hours as needed.   Yes Historical Provider, MD  ALPRAZolam Prudy Feeler) 0.5 MG tablet Take 0.5 mg by mouth 2 (two) times daily as needed for anxiety.   Yes Historical Provider, MD  amLODipine (NORVASC) 5 MG tablet Take 2.5 mg by mouth 2 (two) times daily.   Yes Historical Provider, MD  aspirin 81 MG tablet Take 81 mg by mouth daily.   Yes Historical Provider, MD  Cholecalciferol (VITAMIN D-3) 1000 units CAPS Take 2,000 capsules by mouth daily.   Yes Historical Provider, MD  lamoTRIgine (LAMICTAL) 200 MG tablet Take 200 mg by mouth daily.   Yes Historical Provider, MD  lamoTRIgine (LAMICTAL) 25 MG tablet Take 25 mg by mouth daily.   Yes Historical Provider, MD  lovastatin (MEVACOR) 20 MG tablet Take 20 mg by mouth at bedtime.   Yes Historical Provider, MD  mometasone (NASONEX) 50 MCG/ACT nasal spray Place 2 sprays into the nose daily.   Yes Historical Provider, MD  risperiDONE (RISPERDAL) 0.5 MG tablet Take 0.5 mg by mouth at bedtime.   Yes Historical Provider, MD  sertraline (ZOLOFT) 50 MG tablet Take 50 mg by mouth daily.   Yes Historical Provider, MD  traZODone (DESYREL) 50 MG tablet Take 50 mg by mouth at bedtime.   Yes Historical Provider, MD  nitroGLYCERIN (NITROSTAT) 0.4 MG SL tablet Place 0.4 mg under the tongue every 5 (five) minutes as needed for chest pain.    Historical Provider, MD   Meds Ordered and Administered this Visit   Medications  ketorolac (TORADOL) injection 15 mg (15 mg  Intramuscular Given 03/07/16 1624)    BP 119/64 mmHg  Pulse 70  Temp(Src) 97.8 F (36.6 C) (Oral)  Resp 16  Ht 5\' 4"  (1.626 m)  Wt 230 lb (104.327 kg)  BMI 39.46 kg/m2  SpO2 98% Orthostatic VS for the past 24 hrs:  BP- Lying Pulse- Lying BP- Sitting Pulse- Sitting BP- Standing at 0 minutes Pulse- Standing at 0 minutes  03/07/16 1425 140/69 mmHg 69 142/73 mmHg 69 130/74 mmHg 69    Physical Exam  Constitutional: She is oriented to person, place, and time. Vital signs are normal. She appears well-developed and well-nourished. She is active and cooperative.  Non-toxic appearance. She does not have a sickly appearance. She appears ill. No distress.  HENT:  Head: Normocephalic and atraumatic.  Right Ear: Hearing, external ear and ear canal normal. A middle ear effusion is present.  Left Ear: Hearing, external ear and ear canal normal. A middle ear effusion is present.  Nose: Mucosal edema and rhinorrhea present. No nose lacerations, sinus tenderness, nasal deformity, septal deviation or nasal septal hematoma. No epistaxis.  No foreign bodies. Right sinus exhibits no maxillary sinus tenderness and no frontal sinus tenderness. Left sinus exhibits no maxillary sinus tenderness and no frontal sinus tenderness.  Mouth/Throat: Uvula  is midline and mucous membranes are normal. Mucous membranes are not pale, not dry and not cyanotic. She does not have dentures. No oral lesions. No trismus in the jaw. Normal dentition. No dental abscesses, uvula swelling, lacerations or dental caries. Posterior oropharyngeal edema and posterior oropharyngeal erythema present. No oropharyngeal exudate or tonsillar abscesses.  Bilateral TMs with air fluid level clear; cobblestoning posterior pharynx  Eyes: Conjunctivae, EOM and lids are normal. Pupils are equal, round, and reactive to light. Right eye exhibits no chemosis, no discharge, no exudate and no hordeolum. No foreign body present in the right eye. Left eye exhibits  no chemosis, no discharge, no exudate and no hordeolum. No foreign body present in the left eye. Right conjunctiva is not injected. Right conjunctiva has no hemorrhage. Left conjunctiva is not injected. Left conjunctiva has no hemorrhage. No scleral icterus. Right eye exhibits normal extraocular motion and no nystagmus. Left eye exhibits normal extraocular motion and no nystagmus. Right pupil is round and reactive. Left pupil is round and reactive. Pupils are equal.  Neck: Trachea normal and normal range of motion. Neck supple. No tracheal tenderness, no spinous process tenderness and no muscular tenderness present. No rigidity. No tracheal deviation, no edema, no erythema and normal range of motion present. No thyroid mass and no thyromegaly present.  Cardiovascular: Normal rate, regular rhythm, S1 normal, S2 normal, normal heart sounds and intact distal pulses.  PMI is not displaced.  Exam reveals no gallop and no friction rub.   No murmur heard. Pulmonary/Chest: Effort normal and breath sounds normal. No accessory muscle usage or stridor. No respiratory distress. She has no decreased breath sounds. She has no wheezes. She has no rhonchi. She has no rales. She exhibits no tenderness.  Abdominal: Soft. Normal appearance. She exhibits no shifting dullness, no distension, no pulsatile liver, no fluid wave, no abdominal bruit, no ascites, no pulsatile midline mass and no mass. Bowel sounds are decreased. There is no hepatosplenomegaly. There is tenderness in the right upper quadrant and right lower quadrant. There is guarding and CVA tenderness. There is no rigidity, no rebound, no tenderness at McBurney's point and negative Murphy's sign. Hernia confirmed negative in the ventral area.  Dull to percussion x 4 quads; RLQ > RUQ TTP and right CVA tenderness hypoactive bowel sounds x 4 quads; patient sipping water  Musculoskeletal: Normal range of motion. She exhibits no edema.       Right shoulder: Normal.        Left shoulder: Normal.       Right hip: Normal.       Left hip: Normal.       Right knee: Normal.       Left knee: Normal.       Cervical back: Normal.       Thoracic back: She exhibits tenderness and pain. She exhibits normal range of motion, no bony tenderness, no swelling, no edema, no deformity, no laceration, no spasm and normal pulse.       Back:       Right hand: Normal.       Left hand: Normal.  Lymphadenopathy:       Head (right side): No submental, no submandibular, no tonsillar, no preauricular, no posterior auricular and no occipital adenopathy present.       Head (left side): No submental, no submandibular, no tonsillar, no preauricular, no posterior auricular and no occipital adenopathy present.    She has no cervical adenopathy.  Right cervical: No superficial cervical, no deep cervical and no posterior cervical adenopathy present.      Left cervical: No superficial cervical, no deep cervical and no posterior cervical adenopathy present.  Neurological: She is alert and oriented to person, place, and time. She has normal strength. She is not disoriented. She displays no atrophy and no tremor. No cranial nerve deficit or sensory deficit. She exhibits normal muscle tone. She displays no seizure activity. Coordination and gait normal. GCS eye subscore is 4. GCS verbal subscore is 5. GCS motor subscore is 6.  Skin: Skin is warm, dry and intact. No abrasion, no bruising, no burn, no ecchymosis, no laceration, no lesion, no petechiae and no rash noted. She is not diaphoretic. No cyanosis or erythema. No pallor. Nails show no clubbing.  Psychiatric: She has a normal mood and affect. Her speech is normal and behavior is normal. Judgment and thought content normal. Cognition and memory are normal.  Nursing note and vitals reviewed.   ED Course  Procedures (including critical care time)  Labs Review Labs Reviewed  URINALYSIS COMPLETEWITH MICROSCOPIC ([facility] ONLY) - Abnormal;  Notable for the following:    Specific Gravity, Urine <1.005 (*)    Squamous Epithelial / LPF 0-5 (*)    All other components within normal limits  COMPREHENSIVE METABOLIC PANEL - Abnormal; Notable for the following:    Sodium 129 (*)    Chloride 95 (*)    Glucose, Bld 129 (*)    BUN 27 (*)    Creatinine, Ser 1.18 (*)    Total Protein 8.3 (*)    GFR calc non Af Amer 46 (*)    GFR calc Af Amer 53 (*)    All other components within normal limits    Imaging Review No results found.   1310 Dizzyness with position changes discussed wheelchair with assist from RN when ready to bathroom; CBC results from [facility] available in Epic WNL CMP still pending; pending sample for urinalysis discussed with patient if no bacteria and blood in urine consider CT scan for kidney stones with + CVA tenderness.  Patient verbalized understanding of information/instructions, agreed with plan of care and had no further questions at this time.   1345 patient attempted urination small amount and then accidentally spilled cup in toilet required assist to bathroom via wheelchair.  Assisted back to exam room has water and ice tea to drink will attempt urination again when ready.  1445 discussed orthostatics and [facility] CMP will not be back until tomorrow per nurse at [facility] clinic as lab staff out of today was ship out.  Agreed to go ahead with CMP here at this time.  Submitted urine sample and had a watery stool in bathroom.  1610 discussed CMP results with patient stable compared to [facility] labs Feb 2017 per Epic copy of all results given to patient.  Patient prefers to go home and follow up with nephrology tomorrow.  Does not want imaging at this time.  Discussed typical plan of care if nephrolithiasis.  Do not think infection at this time with normal urinalysis and CBC.  ER tonight if sudden worsening overnight unable to void, brown urine, worsening abdomen/back pain, gross hematuria, syncope, hematochezia.  Discussed loperamide  2mg  po with next episode of diarrhea if needed and may repeat q2h x 2 with loose stools  Avoid dairy, fried, spicy, meat tonight.  Bland diet.  Broth, powerade/gatorade hydrate tonight.  Patient requested pain medications discussed toradol required renal dosing adjustment and  no motrin/naproxen/advil/aleve for next 24 hours only tylenol 500mg  po QID prn pain.  Patient and sister verbalized understanding information/instructions, agreed with plan of care and had no further questions at this time.  1620 patient ambulated to bathroom and back with minimal assistance urine pale clear yellow  1624 Ancil Boozeramille Parker CMA administered 15mg  toradol IM; pending repeat VS in 15 minutes.  1645 VSS patient ambulated to bathroom again without difficulty clear yellow urine.  Patient reported pain improving 4-5/10 compared to 5-6/10 prior to toradol.  Sister reported patient has been having right flank pain for approximately one month worsening.  Patient reported she refused nephrectomy this month as family reunion scheduled the first month of June and she did not want to miss it.  Reiterated foods to avoid with diarrhea.  Soup broths, gatorade/powerade alternating with water due to hyponatremia.   Avoid driving if dizzy.  ER tonight if tea colored urine, gross hematuria, worsening pain, vomiting, unable to void every 8 hours.  Patient to schedule follow up this week with nephrology/PCM for re-evaluation and notify them she was at urgent care for dizzyness/flank pain.  Avoid motrin/advil/aleve/naproxen x 24 hours due to toradol injection.  May take tylenol max 1000mg  po QID prn pain.  Patient verbalized understanding info/instructions, agreed with plan of care and had no further questions at this time.  Filed Vitals:   03/07/16 1226 03/07/16 1643  BP: 116/62 119/64  Pulse: 70 70  Temp: 97.7 F (36.5 C) 97.8 F (36.6 C)  TempSrc: Oral Oral  Resp: 18 16  Height: 5\' 4"  (1.626 m)   Weight: 230 lb (104.327 kg)   SpO2:  96% 98%   Orthostatic VS for the past 24 hrs:  BP- Lying Pulse- Lying BP- Sitting Pulse- Sitting BP- Standing at 0 minutes Pulse- Standing at 0 minutes  03/07/16 1425 140/69 mmHg 69 142/73 mmHg 69 130/74 mmHg 69    Patient has pacemaker  MDM   1. Dehydration with hyponatremia   2. Viral gastroenteritis   3. History of kidney cancer   Mild dehydration due to NPO for fasting labs/diarrhea/viral infection dry mucous membranes.  Discussed with patient and sister to push po fluids (water, broth, gatorade, pedialyte, ginger ale) to ensure urine pale yellow clear, mucous membranes wet/pink no white coating on tongue.  Prolonged dehydration could worsen kidney function and cause acute renal insufficiency.  Follow up if lethargy, unable to urinate every 8 hours, brown/cola colored urine and/or gross hematuria.  Patient with known kidney tumor flank pain could be related to increased size of tumor, nephrolithiasis.  Discussed possible imaging today patient refused after labs completed stated will follow up with nephrology and PCM.  Exitcare handout on dehydration and hyponatremia given to patient.  Patient and sister verbalized understanding of information/instructions, agreed with plan of care and had no further questions at this time.  .  I have recommended clear fluids and bland diet.  Avoid dairy/spicy, fried and large portions of meat while having nausea.  If vomiting hold po intake x 1 hour.  Then sips clear fluids like broths, ginger ale, power ade, gatorade, pedialyte may advance to soft/bland if no vomiting x 24 hours and appetite returned otherwise hydration main focus. Immodium 2mg  after stool loose may repeat twice in 24 hours every 2 hours (OTC).     Return to the clinic if symptoms persist or worsen; I have alerted the patient to call if high fever, dehydration, marked weakness, fainting, increased abdominal pain, blood in stool or  vomit (red or black).   Exitcare handout on gastroenteritis given  to patient. Sister and Patient verbalized agreement and understanding of treatment plan and had no further questions at this time.    Barbaraann Barthel, NP 03/07/16 2053

## 2016-04-04 ENCOUNTER — Other Ambulatory Visit: Payer: Self-pay | Admitting: Family Medicine

## 2016-04-04 DIAGNOSIS — Z1231 Encounter for screening mammogram for malignant neoplasm of breast: Secondary | ICD-10-CM

## 2016-04-24 ENCOUNTER — Ambulatory Visit: Payer: Medicare HMO

## 2016-06-26 ENCOUNTER — Other Ambulatory Visit: Payer: Self-pay | Admitting: Family Medicine

## 2016-06-26 ENCOUNTER — Ambulatory Visit
Admission: RE | Admit: 2016-06-26 | Discharge: 2016-06-26 | Disposition: A | Discharge status: Home / Self Care | Payer: Medicare HMO | Source: Ambulatory Visit | Attending: Family Medicine | Admitting: Family Medicine

## 2016-06-26 DIAGNOSIS — Z1231 Encounter for screening mammogram for malignant neoplasm of breast: Secondary | ICD-10-CM

## 2016-12-28 ENCOUNTER — Other Ambulatory Visit: Payer: Self-pay | Admitting: Family Medicine

## 2016-12-28 DIAGNOSIS — Z780 Asymptomatic menopausal state: Secondary | ICD-10-CM

## 2017-02-27 ENCOUNTER — Other Ambulatory Visit: Payer: Self-pay | Admitting: Family Medicine

## 2017-02-27 DIAGNOSIS — Z1239 Encounter for other screening for malignant neoplasm of breast: Secondary | ICD-10-CM

## 2017-07-05 ENCOUNTER — Ambulatory Visit
Admission: RE | Admit: 2017-07-05 | Discharge: 2017-07-05 | Disposition: A | Discharge status: Home / Self Care | Payer: Medicare HMO | Source: Ambulatory Visit | Attending: Family Medicine | Admitting: Family Medicine

## 2017-07-05 DIAGNOSIS — M85852 Other specified disorders of bone density and structure, left thigh: Secondary | ICD-10-CM | POA: Insufficient documentation

## 2017-07-05 DIAGNOSIS — Z1231 Encounter for screening mammogram for malignant neoplasm of breast: Secondary | ICD-10-CM | POA: Insufficient documentation

## 2017-07-05 DIAGNOSIS — Z780 Asymptomatic menopausal state: Secondary | ICD-10-CM | POA: Diagnosis present

## 2017-07-05 DIAGNOSIS — Z1239 Encounter for other screening for malignant neoplasm of breast: Secondary | ICD-10-CM

## 2018-08-19 ENCOUNTER — Other Ambulatory Visit: Payer: Self-pay | Admitting: Family Medicine

## 2018-08-19 DIAGNOSIS — Z1231 Encounter for screening mammogram for malignant neoplasm of breast: Secondary | ICD-10-CM

## 2018-10-30 ENCOUNTER — Ambulatory Visit
Admission: RE | Admit: 2018-10-30 | Discharge: 2018-10-30 | Disposition: A | Discharge status: Home / Self Care | Payer: Medicare HMO | Source: Ambulatory Visit | Attending: Family Medicine | Admitting: Family Medicine

## 2018-10-30 DIAGNOSIS — Z1231 Encounter for screening mammogram for malignant neoplasm of breast: Secondary | ICD-10-CM | POA: Insufficient documentation

## 2019-07-23 ENCOUNTER — Other Ambulatory Visit: Payer: Self-pay | Admitting: Family Medicine

## 2019-07-23 DIAGNOSIS — Z1231 Encounter for screening mammogram for malignant neoplasm of breast: Secondary | ICD-10-CM

## 2019-12-07 ENCOUNTER — Ambulatory Visit: Payer: Medicare HMO

## 2019-12-10 ENCOUNTER — Ambulatory Visit: Payer: Medicare HMO

## 2019-12-11 ENCOUNTER — Ambulatory Visit: Payer: Medicare HMO | Attending: Internal Medicine

## 2019-12-11 DIAGNOSIS — Z23 Encounter for immunization: Secondary | ICD-10-CM

## 2020-01-05 ENCOUNTER — Ambulatory Visit: Payer: Medicare HMO | Attending: Internal Medicine

## 2020-01-05 DIAGNOSIS — Z23 Encounter for immunization: Secondary | ICD-10-CM

## 2020-01-05 NOTE — Progress Notes (Signed)
   Covid-19 Vaccination Clinic  Name:  Latoya Bell    MRN: [id number] DOB: 11-Jul-1946  01/05/2020  [name] was observed post Covid-19 immunization for 15 minutes without incident. She was provided with Vaccine Information Sheet and instruction to access the V-Safe system.   [name] was instructed to call 911 with any severe reactions post vaccine: Marland Kitchen Difficulty breathing  . Swelling of face and throat  . A fast heartbeat  . A bad rash all over body  . Dizziness and weakness   Immunizations Administered    Name Date Dose VIS Date Route   Pfizer COVID-19 Vaccine 01/05/2020 11:46 AM 0.3 mL 10/09/2019 Intramuscular   Manufacturer: [employer], [employer]   Lot: [id number]   NDC: [address]-9

## 2020-04-15 ENCOUNTER — Other Ambulatory Visit: Payer: Self-pay | Admitting: Student

## 2020-04-15 DIAGNOSIS — Z1231 Encounter for screening mammogram for malignant neoplasm of breast: Secondary | ICD-10-CM

## 2020-05-06 ENCOUNTER — Ambulatory Visit
Admission: RE | Admit: 2020-05-06 | Discharge: 2020-05-06 | Disposition: A | Discharge status: Home / Self Care | Payer: Medicare HMO | Source: Ambulatory Visit | Attending: Student | Admitting: Student

## 2020-05-06 DIAGNOSIS — Z1231 Encounter for screening mammogram for malignant neoplasm of breast: Secondary | ICD-10-CM | POA: Insufficient documentation

## 2021-06-19 ENCOUNTER — Other Ambulatory Visit: Payer: Self-pay | Admitting: Student

## 2021-06-19 DIAGNOSIS — Z1231 Encounter for screening mammogram for malignant neoplasm of breast: Secondary | ICD-10-CM

## 2021-07-10 ENCOUNTER — Other Ambulatory Visit: Payer: Self-pay

## 2021-07-10 ENCOUNTER — Ambulatory Visit
Admission: RE | Admit: 2021-07-10 | Discharge: 2021-07-10 | Disposition: A | Discharge status: Home / Self Care | Payer: Medicare HMO | Source: Ambulatory Visit | Attending: Student | Admitting: Student

## 2021-07-10 DIAGNOSIS — Z1231 Encounter for screening mammogram for malignant neoplasm of breast: Secondary | ICD-10-CM

## 2022-02-04 IMAGING — MG DIGITAL SCREENING BILAT W/ TOMO W/ CAD
6 of 10 series · 6 of 30 positions shown · non-contrast
Comparison: Previous exam(s).

CLINICAL DATA: Screening.

EXAM:
DIGITAL SCREENING BILATERAL MAMMOGRAM WITH TOMO AND CAD

[R CC synth-2D]
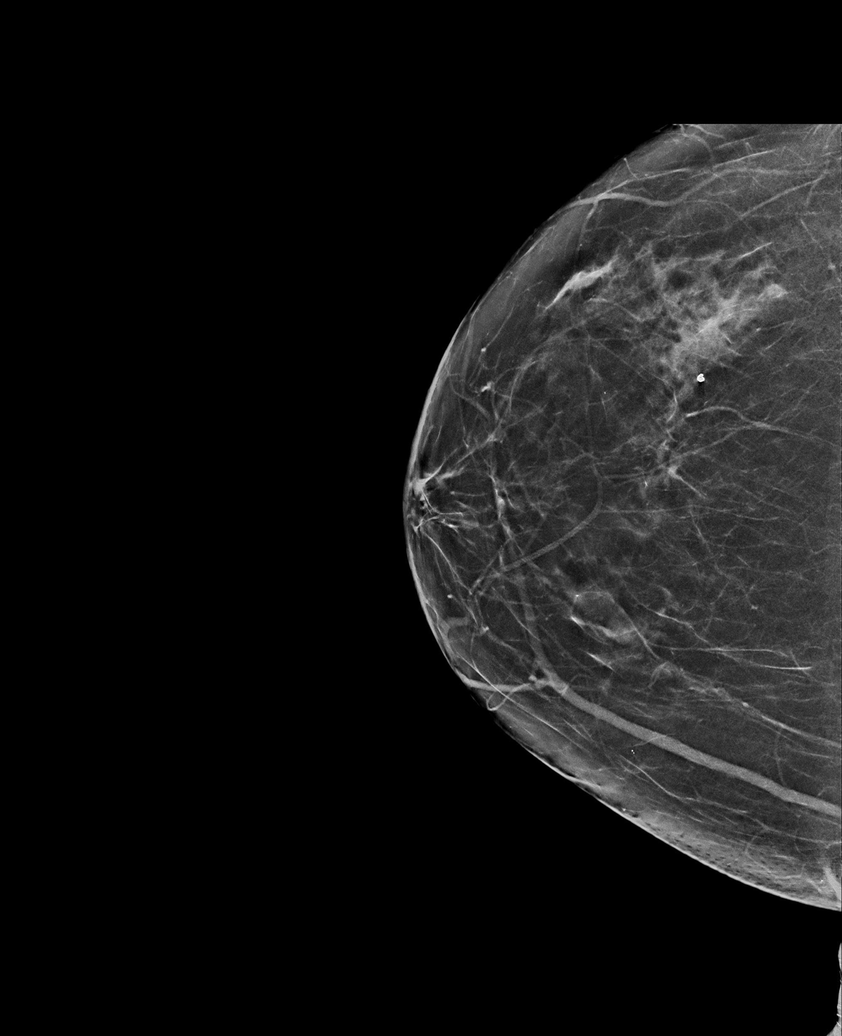

[L MLO synth-2D (1 of 2)]
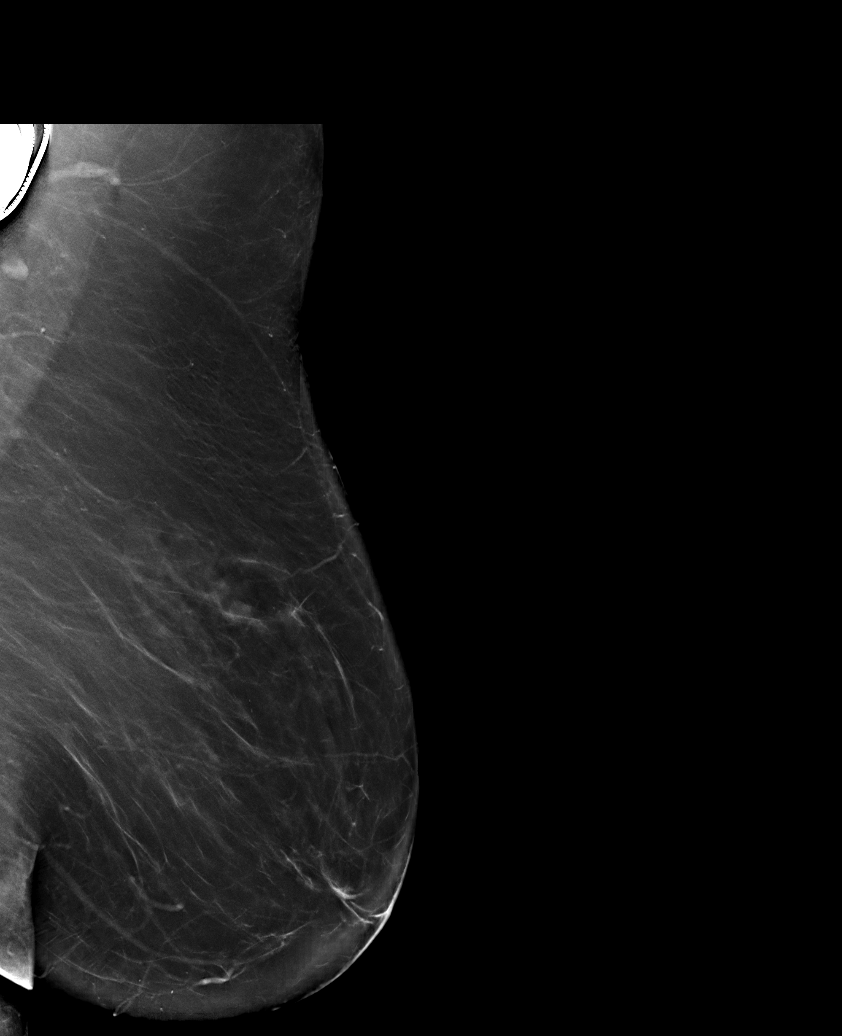

[R MLO synth-2D]
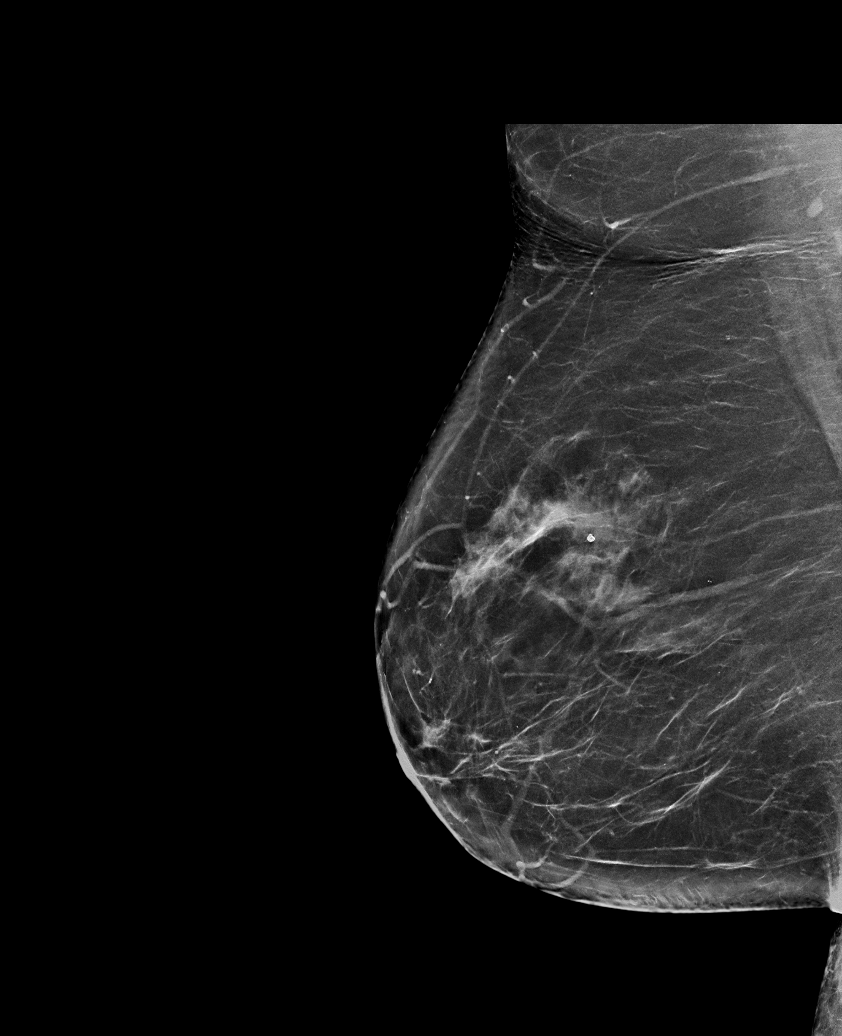

[L CC synth-2D]
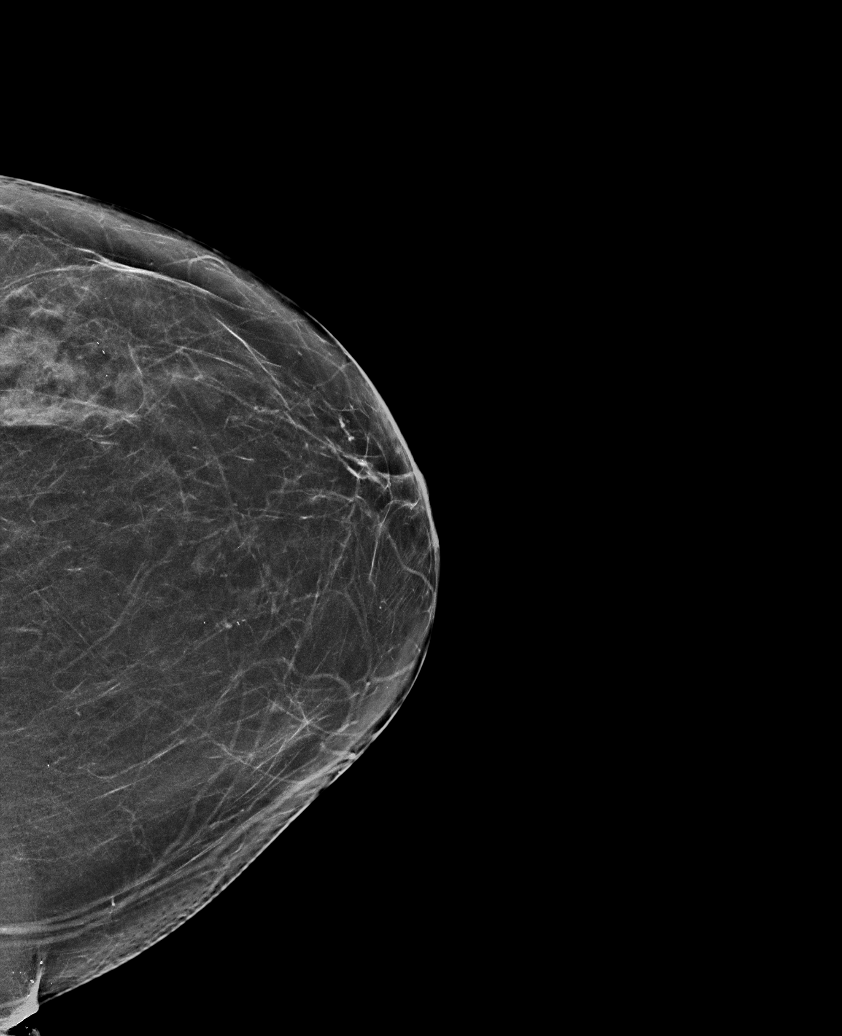

[L MLO synth-2D (2 of 2)]
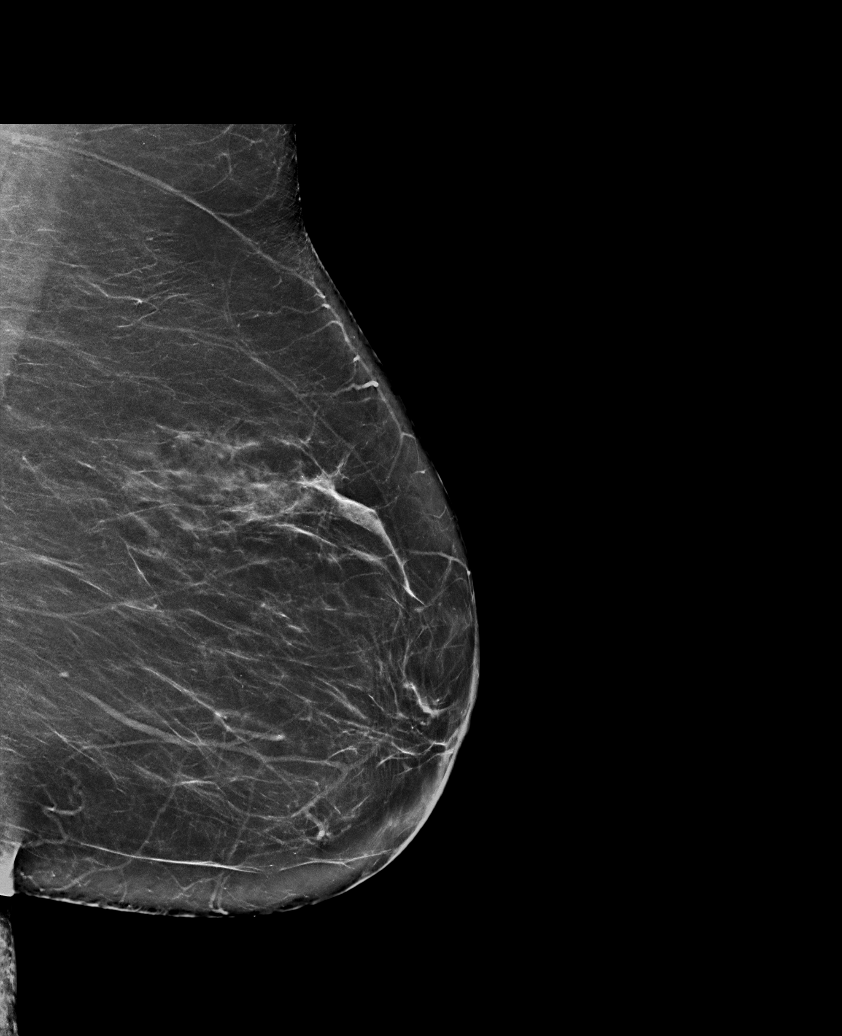

[L CC tomo · tomo slice 35/69.0]
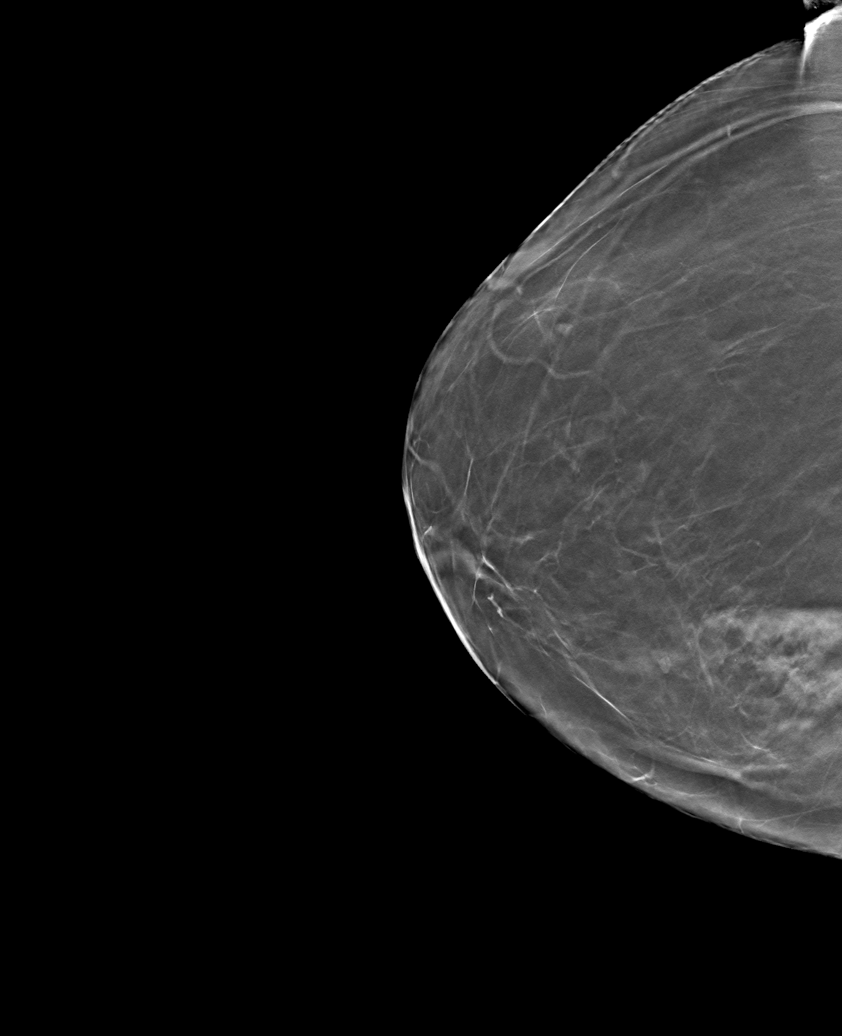

[6 of 30 positions shown; findings below may reference images not displayed]

ACR Breast Density Category b: There are scattered areas of
fibroglandular density.
FINDINGS: There are no findings suspicious for malignancy. Images were
processed with CAD.
IMPRESSION: No mammographic evidence of malignancy. A result letter of this
screening mammogram will be mailed directly to the patient.

RECOMMENDATION:
Screening mammogram in one year. (Code:[id number])

BI-RADS CATEGORY  1: Negative.

## 2022-07-27 ENCOUNTER — Other Ambulatory Visit: Payer: Self-pay | Admitting: Student

## 2022-07-27 DIAGNOSIS — Z1231 Encounter for screening mammogram for malignant neoplasm of breast: Secondary | ICD-10-CM

## 2022-08-24 ENCOUNTER — Ambulatory Visit
Admission: RE | Admit: 2022-08-24 | Discharge: 2022-08-24 | Disposition: A | Discharge status: Home / Self Care | Payer: Medicare HMO | Source: Ambulatory Visit | Attending: Student | Admitting: Student

## 2022-08-24 DIAGNOSIS — Z1231 Encounter for screening mammogram for malignant neoplasm of breast: Secondary | ICD-10-CM

## 2023-08-16 ENCOUNTER — Other Ambulatory Visit: Payer: Self-pay | Admitting: Student

## 2023-08-16 DIAGNOSIS — Z1231 Encounter for screening mammogram for malignant neoplasm of breast: Secondary | ICD-10-CM

## 2023-10-08 ENCOUNTER — Ambulatory Visit
Admission: RE | Admit: 2023-10-08 | Discharge: 2023-10-08 | Disposition: A | Discharge status: Home / Self Care | Payer: Medicare HMO | Source: Ambulatory Visit | Attending: Student | Admitting: Student

## 2023-10-08 DIAGNOSIS — Z1231 Encounter for screening mammogram for malignant neoplasm of breast: Secondary | ICD-10-CM | POA: Insufficient documentation

## 2023-10-15 ENCOUNTER — Other Ambulatory Visit: Payer: Self-pay | Admitting: Student

## 2023-10-15 DIAGNOSIS — R928 Other abnormal and inconclusive findings on diagnostic imaging of breast: Secondary | ICD-10-CM

## 2023-10-18 ENCOUNTER — Ambulatory Visit
Admission: RE | Admit: 2023-10-18 | Discharge: 2023-10-18 | Disposition: A | Discharge status: Home / Self Care | Payer: Medicare HMO | Source: Ambulatory Visit | Attending: Student | Admitting: Student

## 2023-10-18 DIAGNOSIS — R928 Other abnormal and inconclusive findings on diagnostic imaging of breast: Secondary | ICD-10-CM | POA: Diagnosis present

## 2024-09-10 ENCOUNTER — Other Ambulatory Visit: Payer: Self-pay | Admitting: Student

## 2024-09-10 DIAGNOSIS — Z1231 Encounter for screening mammogram for malignant neoplasm of breast: Secondary | ICD-10-CM

## 2024-10-19 ENCOUNTER — Ambulatory Visit
Admission: RE | Admit: 2024-10-19 | Discharge: 2024-10-19 | Disposition: A | Discharge status: Home / Self Care | Source: Ambulatory Visit | Attending: Student | Admitting: Student

## 2024-10-19 DIAGNOSIS — Z1231 Encounter for screening mammogram for malignant neoplasm of breast: Secondary | ICD-10-CM | POA: Insufficient documentation
